# Patient Record
Sex: Male | Born: 1995 | Race: White | Hispanic: No | Marital: Single | Smoking: Never smoker
Health system: Southern US, Community
[De-identification: ages and names within clinical notes are randomized; demographics above are authoritative.]

---

## 2017-10-12 ENCOUNTER — Emergency Department (HOSPITAL_COMMUNITY): Payer: BLUE CROSS/BLUE SHIELD

## 2017-10-12 ENCOUNTER — Other Ambulatory Visit: Payer: Self-pay

## 2017-10-12 ENCOUNTER — Inpatient Hospital Stay (HOSPITAL_COMMUNITY): Payer: BLUE CROSS/BLUE SHIELD

## 2017-10-12 ENCOUNTER — Encounter (HOSPITAL_COMMUNITY): Payer: Self-pay | Admitting: *Deleted

## 2017-10-12 ENCOUNTER — Inpatient Hospital Stay (HOSPITAL_COMMUNITY)
Admission: EM | Admit: 2017-10-12 | Discharge: 2017-10-14 | DRG: 492 | Disposition: A | Discharge status: Home Health | Payer: BLUE CROSS/BLUE SHIELD | Attending: General Surgery | Admitting: General Surgery

## 2017-10-12 DIAGNOSIS — S022XXA Fracture of nasal bones, initial encounter for closed fracture: Secondary | ICD-10-CM | POA: Diagnosis present

## 2017-10-12 DIAGNOSIS — K661 Hemoperitoneum: Secondary | ICD-10-CM | POA: Diagnosis present

## 2017-10-12 DIAGNOSIS — F10129 Alcohol abuse with intoxication, unspecified: Secondary | ICD-10-CM | POA: Diagnosis present

## 2017-10-12 DIAGNOSIS — S82841A Displaced bimalleolar fracture of right lower leg, initial encounter for closed fracture: Secondary | ICD-10-CM | POA: Diagnosis present

## 2017-10-12 DIAGNOSIS — Y907 Blood alcohol level of 200-239 mg/100 ml: Secondary | ICD-10-CM | POA: Diagnosis present

## 2017-10-12 DIAGNOSIS — S92352A Displaced fracture of fifth metatarsal bone, left foot, initial encounter for closed fracture: Secondary | ICD-10-CM | POA: Diagnosis present

## 2017-10-12 DIAGNOSIS — S82891A Other fracture of right lower leg, initial encounter for closed fracture: Secondary | ICD-10-CM

## 2017-10-12 DIAGNOSIS — T148XXA Other injury of unspecified body region, initial encounter: Secondary | ICD-10-CM | POA: Diagnosis present

## 2017-10-12 DIAGNOSIS — R52 Pain, unspecified: Secondary | ICD-10-CM

## 2017-10-12 DIAGNOSIS — S62312A Displaced fracture of base of third metacarpal bone, right hand, initial encounter for closed fracture: Secondary | ICD-10-CM | POA: Diagnosis present

## 2017-10-12 DIAGNOSIS — S27321A Contusion of lung, unilateral, initial encounter: Secondary | ICD-10-CM

## 2017-10-12 DIAGNOSIS — S46811A Strain of other muscles, fascia and tendons at shoulder and upper arm level, right arm, initial encounter: Secondary | ICD-10-CM | POA: Diagnosis present

## 2017-10-12 DIAGNOSIS — R58 Hemorrhage, not elsewhere classified: Secondary | ICD-10-CM

## 2017-10-12 DIAGNOSIS — Z09 Encounter for follow-up examination after completed treatment for conditions other than malignant neoplasm: Secondary | ICD-10-CM

## 2017-10-12 DIAGNOSIS — Z79899 Other long term (current) drug therapy: Secondary | ICD-10-CM

## 2017-10-12 LAB — URINALYSIS, ROUTINE W REFLEX MICROSCOPIC
BACTERIA UA: NONE SEEN
Bilirubin Urine: NEGATIVE
Glucose, UA: NEGATIVE mg/dL
KETONES UR: NEGATIVE mg/dL
Leukocytes, UA: NEGATIVE
Nitrite: NEGATIVE
PROTEIN: NEGATIVE mg/dL
RBC / HPF: NONE SEEN RBC/hpf (ref 0–5)
Specific Gravity, Urine: 1.038 — ABNORMAL HIGH (ref 1.005–1.030)
Squamous Epithelial / LPF: NONE SEEN
pH: 5 (ref 5.0–8.0)

## 2017-10-12 LAB — PROTIME-INR
INR: 1.01
PROTHROMBIN TIME: 13.2 s (ref 11.4–15.2)

## 2017-10-12 LAB — MRSA PCR SCREENING: MRSA by PCR: NEGATIVE

## 2017-10-12 LAB — CBC
HCT: 43.4 % (ref 39.0–52.0)
HEMOGLOBIN: 14.9 g/dL (ref 13.0–17.0)
MCH: 31.6 pg (ref 26.0–34.0)
MCHC: 34.3 g/dL (ref 30.0–36.0)
MCV: 92.1 fL (ref 78.0–100.0)
Platelets: 260 10*3/uL (ref 150–400)
RBC: 4.71 MIL/uL (ref 4.22–5.81)
RDW: 12.1 % (ref 11.5–15.5)
WBC: 14.7 10*3/uL — ABNORMAL HIGH (ref 4.0–10.5)

## 2017-10-12 LAB — COMPREHENSIVE METABOLIC PANEL
ALBUMIN: 4.4 g/dL (ref 3.5–5.0)
ALK PHOS: 73 U/L (ref 38–126)
ALT: 45 U/L (ref 17–63)
ANION GAP: 15 (ref 5–15)
AST: 82 U/L — ABNORMAL HIGH (ref 15–41)
BUN: 9 mg/dL (ref 6–20)
CALCIUM: 8.7 mg/dL — AB (ref 8.9–10.3)
CO2: 20 mmol/L — AB (ref 22–32)
Chloride: 104 mmol/L (ref 101–111)
Creatinine, Ser: 1.1 mg/dL (ref 0.61–1.24)
GFR calc Af Amer: >60 mL/min (ref >60)
GFR calc non Af Amer: >60 mL/min (ref >60)
GLUCOSE: 153 mg/dL — AB (ref 65–99)
Potassium: 3.4 mmol/L — ABNORMAL LOW (ref 3.5–5.1)
SODIUM: 139 mmol/L (ref 135–145)
Total Bilirubin: 0.8 mg/dL (ref 0.3–1.2)
Total Protein: 6.6 g/dL (ref 6.5–8.1)

## 2017-10-12 LAB — I-STAT CHEM 8, ED
BUN: 9 mg/dL (ref 6–20)
CALCIUM ION: 1.02 mmol/L — AB (ref 1.15–1.40)
CHLORIDE: 103 mmol/L (ref 101–111)
Creatinine, Ser: 1.4 mg/dL — ABNORMAL HIGH (ref 0.61–1.24)
Glucose, Bld: 149 mg/dL — ABNORMAL HIGH (ref 65–99)
HCT: 45 % (ref 39.0–52.0)
Hemoglobin: 15.3 g/dL (ref 13.0–17.0)
POTASSIUM: 3.3 mmol/L — AB (ref 3.5–5.1)
SODIUM: 140 mmol/L (ref 135–145)
TCO2: 22 mmol/L (ref 22–32)

## 2017-10-12 LAB — HIV ANTIBODY (ROUTINE TESTING W REFLEX): HIV Screen 4th Generation wRfx: NONREACTIVE

## 2017-10-12 LAB — HEMOGLOBIN
HEMOGLOBIN: 12.8 g/dL — AB (ref 13.0–17.0)
Hemoglobin: 13.9 g/dL (ref 13.0–17.0)
Hemoglobin: 12.3 g/dL — ABNORMAL LOW (ref 13.0–17.0)

## 2017-10-12 LAB — RAPID URINE DRUG SCREEN, HOSP PERFORMED
AMPHETAMINES: NOT DETECTED
Barbiturates: NOT DETECTED
Benzodiazepines: NOT DETECTED
COCAINE: NOT DETECTED
OPIATES: POSITIVE — AB
TETRAHYDROCANNABINOL: NOT DETECTED

## 2017-10-12 LAB — I-STAT CG4 LACTIC ACID, ED: LACTIC ACID, VENOUS: 2.73 mmol/L — AB (ref 0.5–1.9)

## 2017-10-12 LAB — CK TOTAL AND CKMB (NOT AT ARMC)
CK, MB: 9.1 ng/mL — ABNORMAL HIGH (ref 0.5–5.0)
RELATIVE INDEX: 1.4 (ref 0.0–2.5)
Total CK: 674 U/L — ABNORMAL HIGH (ref 49–397)

## 2017-10-12 LAB — SAMPLE TO BLOOD BANK

## 2017-10-12 LAB — CDS SEROLOGY

## 2017-10-12 LAB — ETHANOL: ALCOHOL ETHYL (B): 203 mg/dL — AB (ref <10)

## 2017-10-12 MED ORDER — ONDANSETRON HCL 4 MG/2ML IJ SOLN
4.0000 mg | Freq: Four times a day (QID) | INTRAMUSCULAR | Status: DC | PRN
Start: 2017-10-12 — End: 2017-10-14
  Administered 2017-10-12: 4 mg via INTRAVENOUS
  Filled 2017-10-12: qty 2

## 2017-10-12 MED ORDER — SODIUM CHLORIDE 0.9 % IV BOLUS (SEPSIS)
1000.0000 mL | Freq: Once | INTRAVENOUS | Status: AC
  Administered 2017-10-12: 1000 mL via INTRAVENOUS

## 2017-10-12 MED ORDER — ONDANSETRON 4 MG PO TBDP
4.0000 mg | ORAL_TABLET | Freq: Four times a day (QID) | ORAL | Status: DC | PRN
  Filled 2017-10-12: qty 1

## 2017-10-12 MED ORDER — ONDANSETRON HCL 4 MG/2ML IJ SOLN
4.0000 mg | Freq: Once | INTRAMUSCULAR | Status: AC
  Administered 2017-10-12: 4 mg via INTRAVENOUS
  Filled 2017-10-12: qty 2

## 2017-10-12 MED ORDER — KCL IN DEXTROSE-NACL 20-5-0.45 MEQ/L-%-% IV SOLN
INTRAVENOUS | Status: DC
  Administered 2017-10-12 – 2017-10-14 (×5): via INTRAVENOUS
  Filled 2017-10-12 (×5): qty 1000

## 2017-10-12 MED ORDER — ACETAMINOPHEN 325 MG PO TABS
650.0000 mg | ORAL_TABLET | ORAL | Status: DC | PRN
Start: 2017-10-12 — End: 2017-10-14
  Administered 2017-10-13: 650 mg via ORAL
  Filled 2017-10-12: qty 2

## 2017-10-12 MED ORDER — FENTANYL CITRATE (PF) 100 MCG/2ML IJ SOLN
50.0000 ug | Freq: Once | INTRAMUSCULAR | Status: AC
  Administered 2017-10-12: 50 ug via INTRAVENOUS
  Filled 2017-10-12: qty 2

## 2017-10-12 MED ORDER — DOCUSATE SODIUM 100 MG PO CAPS
100.0000 mg | ORAL_CAPSULE | Freq: Two times a day (BID) | ORAL | Status: DC
  Administered 2017-10-12 – 2017-10-14 (×4): 100 mg via ORAL
  Filled 2017-10-12 (×4): qty 1

## 2017-10-12 MED ORDER — IOPAMIDOL (ISOVUE-300) INJECTION 61%
INTRAVENOUS | Status: AC
  Administered 2017-10-12: 100 mL
  Filled 2017-10-12: qty 100

## 2017-10-12 MED ORDER — TETANUS-DIPHTH-ACELL PERTUSSIS 5-2.5-18.5 LF-MCG/0.5 IM SUSP
0.5000 mL | Freq: Once | INTRAMUSCULAR | Status: AC
  Administered 2017-10-12: 0.5 mL via INTRAMUSCULAR
  Filled 2017-10-12: qty 0.5

## 2017-10-12 MED ORDER — OXYCODONE HCL 5 MG PO TABS
5.0000 mg | ORAL_TABLET | ORAL | Status: DC | PRN
  Administered 2017-10-12 – 2017-10-14 (×5): 5 mg via ORAL
  Filled 2017-10-12 (×5): qty 1

## 2017-10-12 MED ORDER — MORPHINE SULFATE (PF) 4 MG/ML IV SOLN
2.0000 mg | INTRAVENOUS | Status: DC | PRN
  Administered 2017-10-12: 2 mg via INTRAVENOUS
  Administered 2017-10-12: 4 mg via INTRAVENOUS
  Administered 2017-10-12 – 2017-10-13 (×2): 2 mg via INTRAVENOUS
  Administered 2017-10-14: 4 mg via INTRAVENOUS
  Administered 2017-10-14: 2 mg via INTRAVENOUS
  Filled 2017-10-12 (×6): qty 1

## 2017-10-12 MED ORDER — CEFAZOLIN SODIUM-DEXTROSE 1-4 GM/50ML-% IV SOLN
1.0000 g | Freq: Once | INTRAVENOUS | Status: AC
  Administered 2017-10-13: 1 g via INTRAVENOUS
  Filled 2017-10-12: qty 50

## 2017-10-12 NOTE — ED Notes (Signed)
Attempted report x1. 
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
## 2017-10-12 NOTE — ED Notes (Signed)
Nurse drawing labs. 

## 2017-10-12 NOTE — ED Notes (Signed)
Pt taken to CT.
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
## 2017-10-12 NOTE — Progress Notes (Signed)
Orthopedic Tech Progress Note Patient Details:  Adam Murray 09/09/1995 [id number]  Ortho Devices Type of Ortho Device: Postop shoe/boot Ortho Device/Splint Location: lle Ortho Device/Splint Interventions: Application   Post Interventions Patient Tolerated: Well Instructions Provided: Care of device   Nikki DomCrawford, Lajeana Strough 10/12/2017, 1:27 PM
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
## 2017-10-12 NOTE — Progress Notes (Signed)
Orthopedic Tech Progress Note Patient Details:  Adam Murray 01/30/1996 [id number]  Ortho Devices Type of Ortho Device: Velcro wrist splint, Arm sling Ortho Device/Splint Location: rue Ortho Device/Splint Interventions: Application   Post Interventions Patient Tolerated: Well Instructions Provided: Care of device   Nikki DomCrawford, Adam Murray 10/12/2017, 11:45 AM

## 2017-10-12 NOTE — Progress Notes (Signed)
10/12/2017 Patient came from the Emergency room to 28M at 1109. Patient is alert and oriented. Patient have scattered abrasion right knee, left leg, bilateral elbow. Abrasion on face, right shoulder, on top of left foot swelling. Receive Chg bath and tele monitor was placed.  Patient have a  Brace on right foot, dressing clean and dry. At 1700 notice a blister near right elbow. [facility] Terrye Dombrosky RN.
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
## 2017-10-12 NOTE — Progress Notes (Signed)
Orthopedic Tech Progress Note Patient Details:  Adam Murray 08/04/1996 [id number]  Ortho Devices Type of Ortho Device: Post (short leg) splint, Stirrup splint Ortho Device/Splint Location: rle Ortho Device/Splint Interventions: Ordered, Application, Adjustment   Post Interventions Patient Tolerated: Well Instructions Provided: Care of device, Adjustment of device   Adam Murray, Nahiem Dredge J 10/12/2017, 6:22 AM

## 2017-10-13 ENCOUNTER — Inpatient Hospital Stay (HOSPITAL_COMMUNITY): Payer: BLUE CROSS/BLUE SHIELD | Admitting: Certified Registered Nurse Anesthetist

## 2017-10-13 ENCOUNTER — Encounter (HOSPITAL_COMMUNITY): Admission: EM | Disposition: A | Discharge status: Home Health | Payer: Self-pay | Source: Home / Self Care

## 2017-10-13 ENCOUNTER — Encounter (HOSPITAL_COMMUNITY): Payer: Self-pay

## 2017-10-13 ENCOUNTER — Inpatient Hospital Stay (HOSPITAL_COMMUNITY): Payer: BLUE CROSS/BLUE SHIELD

## 2017-10-13 ENCOUNTER — Other Ambulatory Visit: Payer: Self-pay

## 2017-10-13 HISTORY — PX: ORIF ANKLE FRACTURE: SHX5408

## 2017-10-13 LAB — CBC
HCT: 35.3 % — ABNORMAL LOW (ref 39.0–52.0)
Hemoglobin: 12.3 g/dL — ABNORMAL LOW (ref 13.0–17.0)
MCH: 32.3 pg (ref 26.0–34.0)
MCHC: 34.8 g/dL (ref 30.0–36.0)
MCV: 92.7 fL (ref 78.0–100.0)
Platelets: 160 10*3/uL (ref 150–400)
RBC: 3.81 MIL/uL — AB (ref 4.22–5.81)
RDW: 12.3 % (ref 11.5–15.5)
WBC: 7.2 10*3/uL (ref 4.0–10.5)

## 2017-10-13 LAB — BASIC METABOLIC PANEL
Anion gap: 9 (ref 5–15)
BUN: 9 mg/dL (ref 6–20)
CO2: 24 mmol/L (ref 22–32)
Calcium: 8.4 mg/dL — ABNORMAL LOW (ref 8.9–10.3)
Chloride: 102 mmol/L (ref 101–111)
Creatinine, Ser: 0.79 mg/dL (ref 0.61–1.24)
GFR calc Af Amer: >60 mL/min (ref >60)
GFR calc non Af Amer: >60 mL/min (ref >60)
Glucose, Bld: 136 mg/dL — ABNORMAL HIGH (ref 65–99)
POTASSIUM: 4 mmol/L (ref 3.5–5.1)
SODIUM: 135 mmol/L (ref 135–145)

## 2017-10-13 LAB — HEMOGLOBIN: Hemoglobin: 11.8 g/dL — ABNORMAL LOW (ref 13.0–17.0)

## 2017-10-13 SURGERY — OPEN REDUCTION INTERNAL FIXATION (ORIF) ANKLE FRACTURE
Anesthesia: General | Site: Ankle | Laterality: Right

## 2017-10-13 MED ORDER — CEFAZOLIN SODIUM-DEXTROSE 2-3 GM-%(50ML) IV SOLR
INTRAVENOUS | Status: DC | PRN
  Administered 2017-10-13: 2 g via INTRAVENOUS

## 2017-10-13 MED ORDER — CEFAZOLIN SODIUM-DEXTROSE 2-4 GM/100ML-% IV SOLN
2.0000 g | Freq: Three times a day (TID) | INTRAVENOUS | Status: AC
  Administered 2017-10-13 – 2017-10-14 (×3): 2 g via INTRAVENOUS
  Filled 2017-10-13 (×4): qty 100

## 2017-10-13 MED ORDER — HYDROMORPHONE HCL 1 MG/ML IJ SOLN: 0.2500 mg | INTRAMUSCULAR | Status: DC | PRN

## 2017-10-13 MED ORDER — FENTANYL CITRATE (PF) 250 MCG/5ML IJ SOLN
INTRAMUSCULAR | Status: AC
  Filled 2017-10-13: qty 5

## 2017-10-13 MED ORDER — VANCOMYCIN HCL 1000 MG IV SOLR
INTRAVENOUS | Status: AC
  Filled 2017-10-13: qty 1000

## 2017-10-13 MED ORDER — 0.9 % SODIUM CHLORIDE (POUR BTL) OPTIME
TOPICAL | Status: DC | PRN
  Administered 2017-10-13: 1000 mL

## 2017-10-13 MED ORDER — FLUOXETINE HCL 20 MG PO CAPS
20.0000 mg | ORAL_CAPSULE | Freq: Every day | ORAL | Status: DC
  Administered 2017-10-13 – 2017-10-14 (×2): 20 mg via ORAL
  Filled 2017-10-13 (×2): qty 1

## 2017-10-13 MED ORDER — BUPIVACAINE HCL (PF) 0.25 % IJ SOLN
INTRAMUSCULAR | Status: AC
  Filled 2017-10-13: qty 30

## 2017-10-13 MED ORDER — PROPOFOL 10 MG/ML IV BOLUS
INTRAVENOUS | Status: DC | PRN
  Administered 2017-10-13: 200 mg via INTRAVENOUS

## 2017-10-13 MED ORDER — VANCOMYCIN HCL 1000 MG IV SOLR
INTRAVENOUS | Status: DC | PRN
  Administered 2017-10-13: 1000 mg via TOPICAL

## 2017-10-13 MED ORDER — MIDAZOLAM HCL 5 MG/5ML IJ SOLN
INTRAMUSCULAR | Status: DC | PRN
  Administered 2017-10-13: 2 mg via INTRAVENOUS

## 2017-10-13 MED ORDER — PROPOFOL 10 MG/ML IV BOLUS
INTRAVENOUS | Status: AC
  Filled 2017-10-13: qty 40

## 2017-10-13 MED ORDER — BUPIVACAINE-EPINEPHRINE (PF) 0.5% -1:200000 IJ SOLN
INTRAMUSCULAR | Status: DC | PRN
  Administered 2017-10-13: 30 mL via PERINEURAL

## 2017-10-13 MED ORDER — DEXAMETHASONE SODIUM PHOSPHATE 10 MG/ML IJ SOLN
INTRAMUSCULAR | Status: DC | PRN
  Administered 2017-10-13: 10 mg via INTRAVENOUS

## 2017-10-13 MED ORDER — ONDANSETRON HCL 4 MG/2ML IJ SOLN
INTRAMUSCULAR | Status: DC | PRN
  Administered 2017-10-13: 4 mg via INTRAVENOUS

## 2017-10-13 MED ORDER — ROPIVACAINE HCL 7.5 MG/ML IJ SOLN
INTRAMUSCULAR | Status: DC | PRN
  Administered 2017-10-13: 10 mL via PERINEURAL

## 2017-10-13 MED ORDER — FENTANYL CITRATE (PF) 100 MCG/2ML IJ SOLN
INTRAMUSCULAR | Status: DC | PRN
  Administered 2017-10-13 (×3): 50 ug via INTRAVENOUS

## 2017-10-13 MED ORDER — LACTATED RINGERS IV SOLN
INTRAVENOUS | Status: DC | PRN
  Administered 2017-10-13: 07:00:00 via INTRAVENOUS

## 2017-10-13 MED ORDER — MIDAZOLAM HCL 2 MG/2ML IJ SOLN
INTRAMUSCULAR | Status: AC
  Filled 2017-10-13: qty 2

## 2017-10-13 SURGICAL SUPPLY — 59 items
BANDAGE ACE 4X5 VEL STRL LF (GAUZE/BANDAGES/DRESSINGS) ×6 IMPLANT
BIT DRILL 2 CANN GRADUATED (BIT) ×3 IMPLANT
BIT DRILL 2.5 CANN STRL (BIT) ×3 IMPLANT
BIT DRILL 2.6 CANN (BIT) ×3 IMPLANT
BIT DRILL 2.7 (BIT) ×2
BIT DRILL 2.7X2.7/3XSCR ANKL (BIT) ×1 IMPLANT
BIT DRL 2.7X2.7/3XSCR ANKL (BIT) ×1
BNDG COHESIVE 4X5 TAN STRL (GAUZE/BANDAGES/DRESSINGS) ×3 IMPLANT
CANISTER SUCT 3000ML PPV (MISCELLANEOUS) ×3 IMPLANT
CLOSURE STERI-STRIP 1/2X4 (GAUZE/BANDAGES/DRESSINGS) ×1
CLSR STERI-STRIP ANTIMIC 1/2X4 (GAUZE/BANDAGES/DRESSINGS) ×2 IMPLANT
COVER SURGICAL LIGHT HANDLE (MISCELLANEOUS) ×3 IMPLANT
CUFF TOURNIQUET SINGLE 34IN LL (TOURNIQUET CUFF) ×3 IMPLANT
DRAPE OEC MINIVIEW 54X84 (DRAPES) ×3 IMPLANT
DRAPE U-SHAPE 47X51 STRL (DRAPES) ×3 IMPLANT
DURAPREP 26ML APPLICATOR (WOUND CARE) ×6 IMPLANT
ELECT REM PT RETURN 9FT ADLT (ELECTROSURGICAL) ×3
ELECTRODE REM PT RTRN 9FT ADLT (ELECTROSURGICAL) ×1 IMPLANT
GAUZE SPONGE 4X4 12PLY STRL (GAUZE/BANDAGES/DRESSINGS) ×3 IMPLANT
GLOVE BIO SURGEON STRL SZ7.5 (GLOVE) ×6 IMPLANT
GLOVE BIO SURGEON STRL SZ8 (GLOVE) ×3 IMPLANT
GLOVE BIOGEL PI IND STRL 8 (GLOVE) ×2 IMPLANT
GLOVE BIOGEL PI INDICATOR 8 (GLOVE) ×4
GOWN STRL REUS W/ TWL LRG LVL3 (GOWN DISPOSABLE) ×1 IMPLANT
GOWN STRL REUS W/ TWL XL LVL3 (GOWN DISPOSABLE) ×2 IMPLANT
GOWN STRL REUS W/TWL LRG LVL3 (GOWN DISPOSABLE) ×2
GOWN STRL REUS W/TWL XL LVL3 (GOWN DISPOSABLE) ×4
GUIDEWIRE 1.35MM (WIRE) ×6 IMPLANT
IMPL TIGHTROP W/DRV K-LESS (Anchor) ×1 IMPLANT
IMPLANT TIGHTROPE W/DRV K-LESS (Anchor) ×3 IMPLANT
KIT BASIN OR (CUSTOM PROCEDURE TRAY) ×3 IMPLANT
KIT ROOM TURNOVER OR (KITS) ×3 IMPLANT
NS IRRIG 1000ML POUR BTL (IV SOLUTION) ×3 IMPLANT
PACK ORTHO EXTREMITY (CUSTOM PROCEDURE TRAY) ×3 IMPLANT
PAD ABD 8X10 STRL (GAUZE/BANDAGES/DRESSINGS) ×6 IMPLANT
PAD ARMBOARD 7.5X6 YLW CONV (MISCELLANEOUS) ×6 IMPLANT
PAD CAST 4YDX4 CTTN HI CHSV (CAST SUPPLIES) ×2 IMPLANT
PADDING CAST COTTON 4X4 STRL (CAST SUPPLIES) ×4
PADDING CAST SYNTHETIC 4 (CAST SUPPLIES) ×2
PADDING CAST SYNTHETIC 4X4 STR (CAST SUPPLIES) ×1 IMPLANT
PLATE 8H TUBULAR N/L THIRD SS (Plate) ×3 IMPLANT
SCREW BN T10 FT 20X2.7XST CORT (Screw) ×1 IMPLANT
SCREW CANC T15 FT 16X4XST (Screw) ×1 IMPLANT
SCREW CANCELLOUS 4.0X16MM (Screw) ×2 IMPLANT
SCREW CANCELLOUS LP 4.0X18 (Screw) ×3 IMPLANT
SCREW CORT 2.7X20 (Screw) ×2 IMPLANT
SCREW LP CANN 4.0X44 (Screw) ×6 IMPLANT
SCREW NON-LOCKING 3.5X12MM (Screw) ×9 IMPLANT
SPLINT PLASTER CAST XFAST 5X30 (CAST SUPPLIES) ×2 IMPLANT
SPLINT PLASTER XFAST SET 5X30 (CAST SUPPLIES) ×4
SUCTION FRAZIER HANDLE 10FR (MISCELLANEOUS) ×2
SUCTION TUBE FRAZIER 10FR DISP (MISCELLANEOUS) ×1 IMPLANT
SUT MNCRL AB 4-0 PS2 18 (SUTURE) ×6 IMPLANT
SUT VIC AB 2-0 CT1 27 (SUTURE) ×4
SUT VIC AB 2-0 CT1 TAPERPNT 27 (SUTURE) ×2 IMPLANT
TOWEL OR 17X26 10 PK STRL BLUE (TOWEL DISPOSABLE) ×3 IMPLANT
TUBE CONNECTING 12'X1/4 (SUCTIONS) ×1
TUBE CONNECTING 12X1/4 (SUCTIONS) ×2 IMPLANT
UNDERPAD 30X30 (UNDERPADS AND DIAPERS) ×6 IMPLANT
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
## 2017-10-13 NOTE — Anesthesia Procedure Notes (Signed)
Anesthesia Regional Block: Popliteal block   Pre-Anesthetic Checklist: ,, timeout performed, Correct Patient, Correct Site, Correct Laterality, Correct Procedure, Correct Position, site marked, Risks and benefits discussed, pre-op evaluation,  At surgeon's request and post-op pain management  Laterality: Right  Prep: Maximum Sterile Barrier Precautions used, chloraprep       Needles:  Injection technique: Single-shot  Needle Type: Echogenic Stimulator Needle     Needle Length: 9cm  Needle Gauge: 21     Additional Needles:   Procedures:,,,, ultrasound used (permanent image in chart),,,,  Narrative:  Start time: 10/13/2017 7:20 AM End time: 10/13/2017 7:30 AM Injection made incrementally with aspirations every 5 mL. Anesthesiologist: Gaynelle AduFitzgerald, Ludger Bones, MD  Additional Notes: 2% Lidocaine skin wheel. Saphenous block with 10cc of 0.75% Ropivicaine plain.

## 2017-10-13 NOTE — Progress Notes (Signed)
R ankle xray completed

## 2017-10-13 NOTE — Interval H&P Note (Signed)
Discussed case, risks and benefits with patient again.  All questions answered, no change to history.  Sovereign Ramiro MD  

## 2017-10-13 NOTE — Anesthesia Procedure Notes (Signed)
Procedure Name: LMA Insertion Date/Time: 10/13/2017 7:46 AM Performed by: Elliot DallyHuggins, Victorian Gunn, CRNA Pre-anesthesia Checklist: Patient identified, Emergency Drugs available, Suction available and Patient being monitored Patient Re-evaluated:Patient Re-evaluated prior to induction Oxygen Delivery Method: Circle System Utilized Preoxygenation: Pre-oxygenation with 100% oxygen Induction Type: IV induction Ventilation: Mask ventilation without difficulty LMA: LMA inserted LMA Size: 5.0 Number of attempts: 1 Airway Equipment and Method: Bite block Placement Confirmation: positive ETCO2 Tube secured with: Tape Dental Injury: Teeth and Oropharynx as per pre-operative assessment
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
## 2017-10-13 NOTE — Progress Notes (Signed)
MRI called and notified that antibiotic is complete and pt can go to MRI whenever they are ready.

## 2017-10-13 NOTE — Progress Notes (Signed)
Right elbow with an abrasion that family states that was a blister that has burst.  Foam dressing applied for comfort and cushion.

## 2017-10-13 NOTE — Anesthesia Preprocedure Evaluation (Addendum)
Anesthesia Evaluation  Patient identified by MRN, date of birth, ID band Patient awake    Reviewed: Allergy & Precautions, H&P , NPO status , Patient's Chart, lab work & pertinent test results  Airway Mallampati: II  TM Distance: >3 FB Neck ROM: Full    Dental no notable dental hx. (+) Teeth Intact, Dental Advisory Given   Pulmonary neg pulmonary ROS,    Pulmonary exam normal breath sounds clear to auscultation       Cardiovascular negative cardio ROS   Rhythm:Regular Rate:Normal     Neuro/Psych negative neurological ROS  negative psych ROS   GI/Hepatic negative GI ROS, Neg liver ROS,   Endo/Other  negative endocrine ROS  Renal/GU negative Renal ROS  negative genitourinary   Musculoskeletal   Abdominal   Peds  Hematology negative hematology ROS (+)   Anesthesia Other Findings   Reproductive/Obstetrics negative OB ROS                            Anesthesia Physical Anesthesia Plan  ASA: I  Anesthesia Plan: General   Post-op Pain Management:  Regional for Post-op pain   Induction: Intravenous  PONV Risk Score and Plan: 3 and Ondansetron, Dexamethasone and Midazolam  Airway Management Planned: LMA  Additional Equipment:   Intra-op Plan:   Post-operative Plan: Extubation in OR  Informed Consent: I have reviewed the patients History and Physical, chart, labs and discussed the procedure including the risks, benefits and alternatives for the proposed anesthesia with the patient or authorized representative who has indicated his/her understanding and acceptance.     Dental advisory given  Plan Discussed with: CRNA  Anesthesia Plan Comments:         Anesthesia Quick Evaluation  

## 2017-10-13 NOTE — Transfer of Care (Signed)
Immediate Anesthesia Transfer of Care Note  Patient: Adam Murray  Procedure(s) Performed: OPEN REDUCTION INTERNAL FIXATION (ORIF) ANKLE FRACTURE (Right Ankle)  Patient Location: PACU  Anesthesia Type:GA combined with regional for post-op pain  Level of Consciousness: awake, alert  and oriented  Airway & Oxygen Therapy: Patient Spontanous Breathing and Patient connected to nasal cannula oxygen  Post-op Assessment: Report given to RN and Post -op Vital signs reviewed and stable  Post vital signs: Reviewed and stable  Last Vitals:  Vitals:   10/13/17 0500 10/13/17 0600  BP: 137/80 135/76  Pulse: 72 74  Resp: 14 14  Temp:    SpO2: 97% 97%    Last Pain:  Vitals:   10/13/17 0540  TempSrc:   PainSc: 9       Patients Stated Pain Goal: 3 (10/12/17 1435)  Complications: No apparent anesthesia complications

## 2017-10-13 NOTE — Anesthesia Postprocedure Evaluation (Signed)
Anesthesia Post Note  Patient: Ernie Avenarevor Rosenstock  Procedure(s) Performed: OPEN REDUCTION INTERNAL FIXATION (ORIF) ANKLE FRACTURE (Right Ankle)     Patient location during evaluation: PACU Anesthesia Type: General and Regional Level of consciousness: awake and alert Pain management: pain level controlled Vital Signs Assessment: post-procedure vital signs reviewed and stable Respiratory status: spontaneous breathing, nonlabored ventilation and respiratory function stable Cardiovascular status: blood pressure returned to baseline and stable Postop Assessment: no apparent nausea or vomiting Anesthetic complications: no    Last Vitals:  Vitals:   10/13/17 1006 10/13/17 1015  BP: 129/75   Pulse: 87 87  Resp: 12 12  Temp:  36.5 C  SpO2: 95% 95%    Last Pain:  Vitals:   10/13/17 1006  TempSrc:   PainSc: 0-No pain                 Mattilynn Forrer,W. EDMOND

## 2017-10-13 NOTE — Progress Notes (Signed)
Kinsinger, MD paged, patient and family would like for patient to resume daily home med; fluoxetine 20mg  one time per day. Kinsinger gives verbal approval.
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
## 2017-10-14 MED ORDER — MORPHINE SULFATE (PF) 2 MG/ML IV SOLN: 2.0000 mg | INTRAVENOUS | Status: DC | PRN

## 2017-10-14 MED ORDER — BACITRACIN ZINC 500 UNIT/GM EX OINT
TOPICAL_OINTMENT | Freq: Two times a day (BID) | CUTANEOUS | Status: DC
  Administered 2017-10-14: 14:00:00 via TOPICAL
  Filled 2017-10-14: qty 28.35

## 2017-10-14 MED ORDER — ACETAMINOPHEN 500 MG PO TABS: 1000.0000 mg | ORAL_TABLET | Freq: Three times a day (TID) | ORAL | 0 refills | Status: AC

## 2017-10-14 MED ORDER — OXYCODONE HCL 5 MG PO TABS
5.0000 mg | ORAL_TABLET | ORAL | Status: DC | PRN
  Administered 2017-10-14 (×3): 10 mg via ORAL
  Filled 2017-10-14 (×3): qty 2

## 2017-10-14 MED ORDER — OXYCODONE HCL 10 MG PO TABS: 5.0000 mg | ORAL_TABLET | Freq: Four times a day (QID) | ORAL | 0 refills | Status: AC | PRN

## 2017-10-14 MED ORDER — BACITRACIN ZINC 500 UNIT/GM EX OINT: TOPICAL_OINTMENT | Freq: Two times a day (BID) | CUTANEOUS | 0 refills | Status: AC

## 2017-10-14 MED ORDER — ACETAMINOPHEN 500 MG PO TABS
1000.0000 mg | ORAL_TABLET | Freq: Three times a day (TID) | ORAL | Status: DC
  Administered 2017-10-14: 1000 mg via ORAL
  Filled 2017-10-14: qty 2
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
## 2017-10-14 NOTE — Plan of Care (Signed)
  Progressing Education: Knowledge of General Education information will improve 10/14/2017 1627 - Progressing by Darreld Mcleanox, Azizah Lisle, RN Health Behavior/Discharge Planning: Ability to manage health-related needs will improve 10/14/2017 1627 - Progressing by Darreld Mcleanox, Kelia Gibbon, RN Clinical Measurements: Ability to maintain clinical measurements within normal limits will improve 10/14/2017 1627 - Progressing by Darreld Mcleanox, Kealohilani Maiorino, RN Will remain free from infection 10/14/2017 1627 - Progressing by Darreld Mcleanox, Deaven Urwin, RN Diagnostic test results will improve 10/14/2017 1627 - Progressing by Darreld Mcleanox, Courtnie Brenes, RN Respiratory complications will improve 10/14/2017 1627 - Progressing by Darreld Mcleanox, Schylar Allard, RN Cardiovascular complication will be avoided 10/14/2017 1627 - Progressing by Darreld Mcleanox, Anjulie Dipierro, RN Activity: Risk for activity intolerance will decrease 10/14/2017 1627 - Progressing by Darreld Mcleanox, Rayen Palen, RN Nutrition: Adequate nutrition will be maintained 10/14/2017 1627 - Progressing by Darreld Mcleanox, Toula Miyasaki, RN Coping: Level of anxiety will decrease 10/14/2017 1627 - Progressing by Darreld Mcleanox, Jatavion Peaster, RN Elimination: Will not experience complications related to bowel motility 10/14/2017 1627 - Progressing by Darreld Mcleanox, Genola Yuille, RN Will not experience complications related to urinary retention 10/14/2017 1627 - Progressing by Darreld Mcleanox, Fleda Pagel, RN Pain Managment: General experience of comfort will improve 10/14/2017 1627 - Progressing by Darreld Mcleanox, Audrina Marten, RN Safety: Ability to remain free from injury will improve 10/14/2017 1627 - Progressing by Darreld Mcleanox, Inda Mcglothen, RN Skin Integrity: Risk for impaired skin integrity will decrease 10/14/2017 1627 - Progressing by Darreld Mcleanox, Andie Mungin, RN

## 2017-10-14 NOTE — Progress Notes (Signed)
ORTHOPAEDIC PROGRESS NOTE  s/p Procedure(s): R OPEN REDUCTION INTERNAL FIXATION (ORIF) ANKLE FRACTURE R shoulder pain  SUBJECTIVE: Reports mild pain about operative site. No chest pain. No SOB. No nausea/vomiting. No other complaints.  OBJECTIVE: PE:  RLE: splint CDI, +EHL though remainder of motor difficult to test due to splint, sensation intact distally with warm well perfused foot, no pain w passive stretch  RUE: Gentle shoulder ROM without significant pain. + Motor in  AIN, PIN, Ulnar distributions. Sensation intact in medial, radial, and ulnar distributions. Negative tinel at carpal tunnel.  2+ radial pulse with warm and well perfused digits. Compartments soft and compressible.    Vitals:   10/13/17 2000 10/13/17 2316  BP: 116/68 119/63  Pulse: 96 85  Resp: 18 16  Temp: 98.2 F (36.8 C)   SpO2: 100% 100%     ASSESSMENT: Ernie Avenarevor Wenrich is a 22 y.o. male doing well postoperatively.  R shoulder radiologist read pending but on my read looks to have no major structural abnormalities precluding WBAT RUE, likely transient subluxation   PLAN: Weightbearing: TDWB RLE Insicional and dressing care: Dressings left intact until follow-up Orthopedic device(s): R wrist removable splint, R ankle splint Showering: ok to shower but keep splint dry VTE prophylaxis: Lovenox 40mg  qd 2 weeks Pain control: prn meds Follow - up plan: 2 weeks Contact information:  Weekdays 8-5 Ramond Marrowax Varkey MD [phone], After hours and holidays please check [email or web address] for group call information for [facility]
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
## 2017-10-14 NOTE — Plan of Care (Signed)
  Adequate for Discharge Education: Knowledge of General Education information will improve 10/14/2017 1745 - Adequate for Discharge by Darreld Mcleanox, Okema Rollinson, RN 10/14/2017 1629 - Progressing by Darreld Mcleanox, Kali Deadwyler, RN 10/14/2017 1627 - Progressing by Darreld Mcleanox, Lounell Schumacher, RN Health Behavior/Discharge Planning: Ability to manage health-related needs will improve 10/14/2017 1745 - Adequate for Discharge by Darreld Mcleanox, Saydee Zolman, RN 10/14/2017 1629 - Progressing by Darreld Mcleanox, Dann Ventress, RN 10/14/2017 1627 - Progressing by Darreld Mcleanox, Quindarrius Joplin, RN Clinical Measurements: Ability to maintain clinical measurements within normal limits will improve 10/14/2017 1745 - Adequate for Discharge by Darreld Mcleanox, Jeneva Schweizer, RN 10/14/2017 1629 - Progressing by Darreld Mcleanox, Siriah Treat, RN 10/14/2017 1627 - Progressing by Darreld Mcleanox, Eric Morganti, RN Will remain free from infection 10/14/2017 1745 - Adequate for Discharge by Darreld Mcleanox, Talisha Erby, RN 10/14/2017 1629 - Progressing by Darreld Mcleanox, Adal Sereno, RN 10/14/2017 1627 - Progressing by Darreld Mcleanox, Cheralyn Oliver, RN Diagnostic test results will improve 10/14/2017 1745 - Adequate for Discharge by Darreld Mcleanox, Jaymarion Trombly, RN 10/14/2017 1629 - Progressing by Darreld Mcleanox, Petro Talent, RN 10/14/2017 1627 - Progressing by Darreld Mcleanox, Kaileen Bronkema, RN Respiratory complications will improve 10/14/2017 1745 - Adequate for Discharge by Darreld Mcleanox, Dewell Monnier, RN 10/14/2017 1629 - Progressing by Darreld Mcleanox, Tambria Pfannenstiel, RN 10/14/2017 1627 - Progressing by Darreld Mcleanox, Aniya Jolicoeur, RN Cardiovascular complication will be avoided 10/14/2017 1745 - Adequate for Discharge by Darreld Mcleanox, Severin Bou, RN 10/14/2017 1629 - Progressing by Darreld Mcleanox, Rumor Sun, RN 10/14/2017 1627 - Progressing by Darreld Mcleanox, Jenner Rosier, RN Activity: Risk for activity intolerance will decrease 10/14/2017 1745 - Adequate for Discharge by Darreld Mcleanox, Chrissy Ealey, RN 10/14/2017 1629 - Progressing by Darreld Mcleanox, Rebecca Cairns, RN 10/14/2017 1627 - Progressing by Darreld Mcleanox, Susy Placzek, RN Nutrition: Adequate nutrition will be maintained 10/14/2017 1745 - Adequate for Discharge by Darreld Mcleanox, Analya Louissaint, RN 10/14/2017 1629 - Progressing by Darreld Mcleanox, Anesia Blackwell, RN 10/14/2017 1627 - Progressing by Darreld Mcleanox, Samaia Iwata, RN Coping: Level of anxiety will  decrease 10/14/2017 1745 - Adequate for Discharge by Darreld Mcleanox, Letetia Romanello, RN 10/14/2017 1629 - Progressing by Darreld Mcleanox, Alexas Basulto, RN 10/14/2017 1627 - Progressing by Darreld Mcleanox, Ismelda Weatherman, RN Elimination: Will not experience complications related to bowel motility 10/14/2017 1745 - Adequate for Discharge by Darreld Mcleanox, Jatorian Renault, RN 10/14/2017 1629 - Progressing by Darreld Mcleanox, Tymia Streb, RN 10/14/2017 1627 - Progressing by Darreld Mcleanox, Roniqua Kintz, RN Will not experience complications related to urinary retention 10/14/2017 1745 - Adequate for Discharge by Darreld Mcleanox, Rohen Kimes, RN 10/14/2017 1629 - Progressing by Darreld Mcleanox, Daquan Crapps, RN 10/14/2017 1627 - Progressing by Darreld Mcleanox, Raju Coppolino, RN Pain Managment: General experience of comfort will improve 10/14/2017 1745 - Adequate for Discharge by Darreld Mcleanox, Subrina Vecchiarelli, RN 10/14/2017 1629 - Progressing by Darreld Mcleanox, Chenee Munns, RN 10/14/2017 1627 - Progressing by Darreld Mcleanox, Demarko Zeimet, RN Safety: Ability to remain free from injury will improve 10/14/2017 1745 - Adequate for Discharge by Darreld Mcleanox, Jerra Huckeby, RN 10/14/2017 1629 - Progressing by Darreld Mcleanox, Daniel Johndrow, RN 10/14/2017 1627 - Progressing by Darreld Mcleanox, Denys Labree, RN Skin Integrity: Risk for impaired skin integrity will decrease 10/14/2017 1745 - Adequate for Discharge by Darreld Mcleanox, Grazia Taffe, RN 10/14/2017 1629 - Progressing by Darreld Mcleanox, Xia Stohr, RN 10/14/2017 1627 - Progressing by Darreld Mcleanox, Nikoli Nasser, RN
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
## 2017-10-14 NOTE — Plan of Care (Signed)
  Progressing Education: Knowledge of General Education information will improve 10/14/2017 1629 - Progressing by Darreld Mcleanox, Hulan Szumski, RN 10/14/2017 1627 - Progressing by Darreld Mcleanox, Korynn Kenedy, RN Health Behavior/Discharge Planning: Ability to manage health-related needs will improve 10/14/2017 1629 - Progressing by Darreld Mcleanox, Carthel Castille, RN 10/14/2017 1627 - Progressing by Darreld Mcleanox, Jerie Basford, RN Clinical Measurements: Ability to maintain clinical measurements within normal limits will improve 10/14/2017 1629 - Progressing by Darreld Mcleanox, Muaz Shorey, RN 10/14/2017 1627 - Progressing by Darreld Mcleanox, Zanaria Morell, RN Will remain free from infection 10/14/2017 1629 - Progressing by Darreld Mcleanox, Martha Soltys, RN 10/14/2017 1627 - Progressing by Darreld Mcleanox, Talha Iser, RN Diagnostic test results will improve 10/14/2017 1629 - Progressing by Darreld Mcleanox, Shamari Lofquist, RN 10/14/2017 1627 - Progressing by Darreld Mcleanox, Lilu Mcglown, RN Respiratory complications will improve 10/14/2017 1629 - Progressing by Darreld Mcleanox, Jannis Atkins, RN 10/14/2017 1627 - Progressing by Darreld Mcleanox, Ellin Fitzgibbons, RN Cardiovascular complication will be avoided 10/14/2017 1629 - Progressing by Darreld Mcleanox, Caitland Porchia, RN 10/14/2017 1627 - Progressing by Darreld Mcleanox, Charleigh Correnti, RN Activity: Risk for activity intolerance will decrease 10/14/2017 1629 - Progressing by Darreld Mcleanox, Evellyn Tuff, RN 10/14/2017 1627 - Progressing by Darreld Mcleanox, Clavin Ruhlman, RN Nutrition: Adequate nutrition will be maintained 10/14/2017 1629 - Progressing by Darreld Mcleanox, Aphrodite Harpenau, RN 10/14/2017 1627 - Progressing by Darreld Mcleanox, Leandrea Ackley, RN Coping: Level of anxiety will decrease 10/14/2017 1629 - Progressing by Darreld Mcleanox, Valeriano Bain, RN 10/14/2017 1627 - Progressing by Darreld Mcleanox, Sinahi Knights, RN Elimination: Will not experience complications related to bowel motility 10/14/2017 1629 - Progressing by Darreld Mcleanox, Robertson Colclough, RN 10/14/2017 1627 - Progressing by Darreld Mcleanox, Nathanael Krist, RN Will not experience complications related to urinary retention 10/14/2017 1629 - Progressing by Darreld Mcleanox, Tomorrow Dehaas, RN 10/14/2017 1627 - Progressing by Darreld Mcleanox, Dontrey Snellgrove, RN Pain Managment: General experience of comfort will improve 10/14/2017 1629 - Progressing by Darreld Mcleanox, Arney Mayabb,  RN 10/14/2017 1627 - Progressing by Darreld Mcleanox, Geniya Fulgham, RN Safety: Ability to remain free from injury will improve 10/14/2017 1629 - Progressing by Darreld Mcleanox, Swan Zayed, RN 10/14/2017 1627 - Progressing by Darreld Mcleanox, Shela Esses, RN Skin Integrity: Risk for impaired skin integrity will decrease 10/14/2017 1629 - Progressing by Darreld Mcleanox, Chae Shuster, RN 10/14/2017 1627 - Progressing by Darreld Mcleanox, Britanee Vanblarcom, RN

## 2017-10-14 NOTE — Discharge Instructions (Signed)
Cast or Splint Care, Adult Casts and splints are supports that are worn to protect broken bones and other injuries. A cast or splint may hold a bone still and in the correct position while it heals. Casts and splints may also help to ease pain, swelling, and muscle spasms. How to care for your cast  Do not stick anything inside the cast to scratch your skin.  Check the skin around the cast every day. Tell your doctor about any concerns.  You may put lotion on dry skin around the edges of the cast. Do not put lotion on the skin under the cast.  Keep the cast clean.  If the cast is not waterproof: ? Do not let it get wet. ? Cover it with a watertight covering when you take a bath or a shower. How to care for your splint  Wear it as told by your doctor. Take it off only as told by your doctor.  Loosen the splint if your fingers or toes tingle, get numb, or turn cold and blue.  Keep the splint clean.  If the splint is not waterproof: ? Do not let it get wet. ? Cover it with a watertight covering when you take a bath or a shower. Follow these instructions at home: Bathing  Do not take baths or swim until your doctor says it is okay. Ask your doctor if you can take showers. You may only be allowed to take sponge baths for bathing.  If your cast or splint is not waterproof, cover it with a watertight covering when you take a bath or shower. Managing pain, stiffness, and swelling  Move your fingers or toes often to avoid stiffness and to lessen swelling.  Raise (elevate) the injured area above the level of your heart while sitting or lying down. Safety  Do not use the injured limb to support your body weight until your doctor says that it is okay.  Use crutches or other assistive devices as told by your doctor. General instructions  Do not put pressure on any part of the cast or splint until it is fully hardened. This may take many hours.  Return to your normal activities as  told by your doctor. Ask your doctor what activities are safe for you.  Keep all follow-up visits as told by your doctor. This is important. Contact a doctor if:  Your cast or splint gets damaged.  The skin around the cast gets red or raw.  The skin under the cast is very itchy or painful.  Your cast or splint feels very uncomfortable.  Your cast or splint is too tight or too loose.  Your cast becomes wet or it starts to have a soft spot or area.  You get an object stuck under your cast. Get help right away if:  Your pain gets worse.  The injured area tingles, gets numb, or turns blue and cold.  The part of your body above or below the cast is swollen and it turns a different color (is discolored).  You cannot feel or move your fingers or toes.  There is fluid leaking through the cast.  You have very bad pain or pressure under the cast.  You have trouble breathing.  You have shortness of breath.  You have chest pain. This information is not intended to replace advice given to you by your health care provider. Make sure you discuss any questions you have with your health care provider. Document Released: 12/13/2010 Document   Revised: 08/03/2016 Document Reviewed: 08/03/2016 Elsevier Interactive Patient Education  2017 [employer].  1. PAIN CONTROL:  1. Pain is best controlled by a usual combination of three different methods TOGETHER:  1. Ice/Heat 2. Over the counter pain medication 3. Prescription pain medication 2. Most patients will experience some swelling and bruising around wounds. Ice packs or heating pads (30-60 minutes up to 6 times a day) will help. Use ice for the first few days to help decrease swelling and bruising, then switch to heat to help relax tight/sore spots and speed recovery. Some people prefer to use ice alone, heat alone, alternating between ice & heat. Experiment to what works for you. Swelling and bruising can take several weeks to resolve.    3. It is helpful to take an over-the-counter pain medication regularly for the first few weeks. Choose one of the following that works best for you:  1. Naproxen (Aleve, etc) Two 220mg  tabs twice a day 2. Ibuprofen (Advil, etc) Three 200mg  tabs four times a day (every meal & bedtime) 3. Acetaminophen (Tylenol, etc) 500-650mg  four times a day (every meal & bedtime) 4. A prescription for pain medication (such as oxycodone, hydrocodone, etc) should be given to you upon discharge. Take your pain medication as prescribed.  1. If you are having problems/concerns with the prescription medicine (does not control pain, nausea, vomiting, rash, itching, etc), please call us [phone] to see if we need to switch you to a different pain medicine that will work better for you and/or control your side effect better. 2. If you need a refill on your pain medication, please contact your pharmacy. They will contact our office to request authorization. Prescriptions will not be filled after 5 pm or on week-ends. 4. Avoid getting constipated. When taking pain medications, it is common to experience some constipation. Increasing fluid intake and taking a fiber supplement (such as Metamucil, Citrucel, FiberCon, MiraLax, etc) 1-2 times a day regularly will usually help prevent this problem from occurring. A mild laxative (prune juice, Milk of Magnesia, MiraLax, etc) should be taken according to package directions if there are no bowel movements after 48 hours.  5. Watch out for diarrhea. If you have many loose bowel movements, simplify your diet to bland foods & liquids for a few days. Stop any stool softeners and decrease your fiber supplement. Switching to mild anti-diarrheal medications (Kayopectate, Pepto Bismol) can help. If this worsens or does not improve, please call us. 6. Wash / shower every day. You may shower daily and replace your bandges after showering. No bathing or submerging your wounds in water until they  heal. 7. FOLLOW UP in our office  1. Please call [facility] at [phone] to set up an appointment for a follow-up appointment approximately 2-3 weeks after discharge for wound check  WHEN TO CALL us [phone]:  1. Poor pain control 2. Reactions / problems with new medications (rash/itching, nausea, etc)  3. Fever over 101.5 F (38.5 C) 4. Worsening swelling or bruising 5. Continued bleeding from wounds. 6. Increased pain, redness, or drainage from the wounds which could be signs of infection  The clinic staff is available to answer your questions during regular business hours (8:30am-5pm). Please dont hesitate to call and ask to speak to one of our nurses for clinical concerns.  If you have a medical emergency, go to the nearest emergency room or call 911.  A surgeon from [facility] is always on call at the hospitals  [facility]  [address], [address], [place], [place] [address] ?  MAIN: [phone] ? TOLL FREE: [phone] ?  FAX [phone]  [email or web address]

## 2017-10-14 NOTE — Progress Notes (Signed)
Orthopedic Tech Progress Note Patient Details:  Adam Murray 06/11/1996 [id number]  Ortho Devices Type of Ortho Device: CAM walker Ortho Device/Splint Location: lle Ortho Device/Splint Interventions: Application   Post Interventions Patient Tolerated: Well Instructions Provided: Care of device   Adam Murray 10/14/2017, 6:17 PM
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
## 2017-10-15 ENCOUNTER — Encounter (HOSPITAL_COMMUNITY): Payer: Self-pay | Admitting: Orthopaedic Surgery
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
